# Patient Record
Sex: Male | Born: 1945 | Marital: Married | Smoking: Never smoker
Health system: Southern US, Community
[De-identification: ages and names within clinical notes are randomized; demographics above are authoritative.]

---

## 2011-02-21 ENCOUNTER — Emergency Department: Payer: Self-pay | Admitting: Emergency Medicine

## 2012-01-27 ENCOUNTER — Ambulatory Visit: Payer: Self-pay | Admitting: Orthopedic Surgery

## 2013-04-23 IMAGING — CR LEFT WRIST - COMPLETE 3+ VIEW
1 series · 4 of 4 positions shown · non-contrast
Comparison: none

REASON FOR EXAM: pain following trauma
COMMENTS:

PROCEDURE:     DXR - DXR WRIST LT COMP WITH OBLIQUES  - February 21, 2011  [DATE]
RESULT:     No fracture, dislocation or other acute bony abnormality is
identified.

[Series 1: w wrist pa left · 0.14mm/px · 4 of 4 slices shown]
[im 1/4]
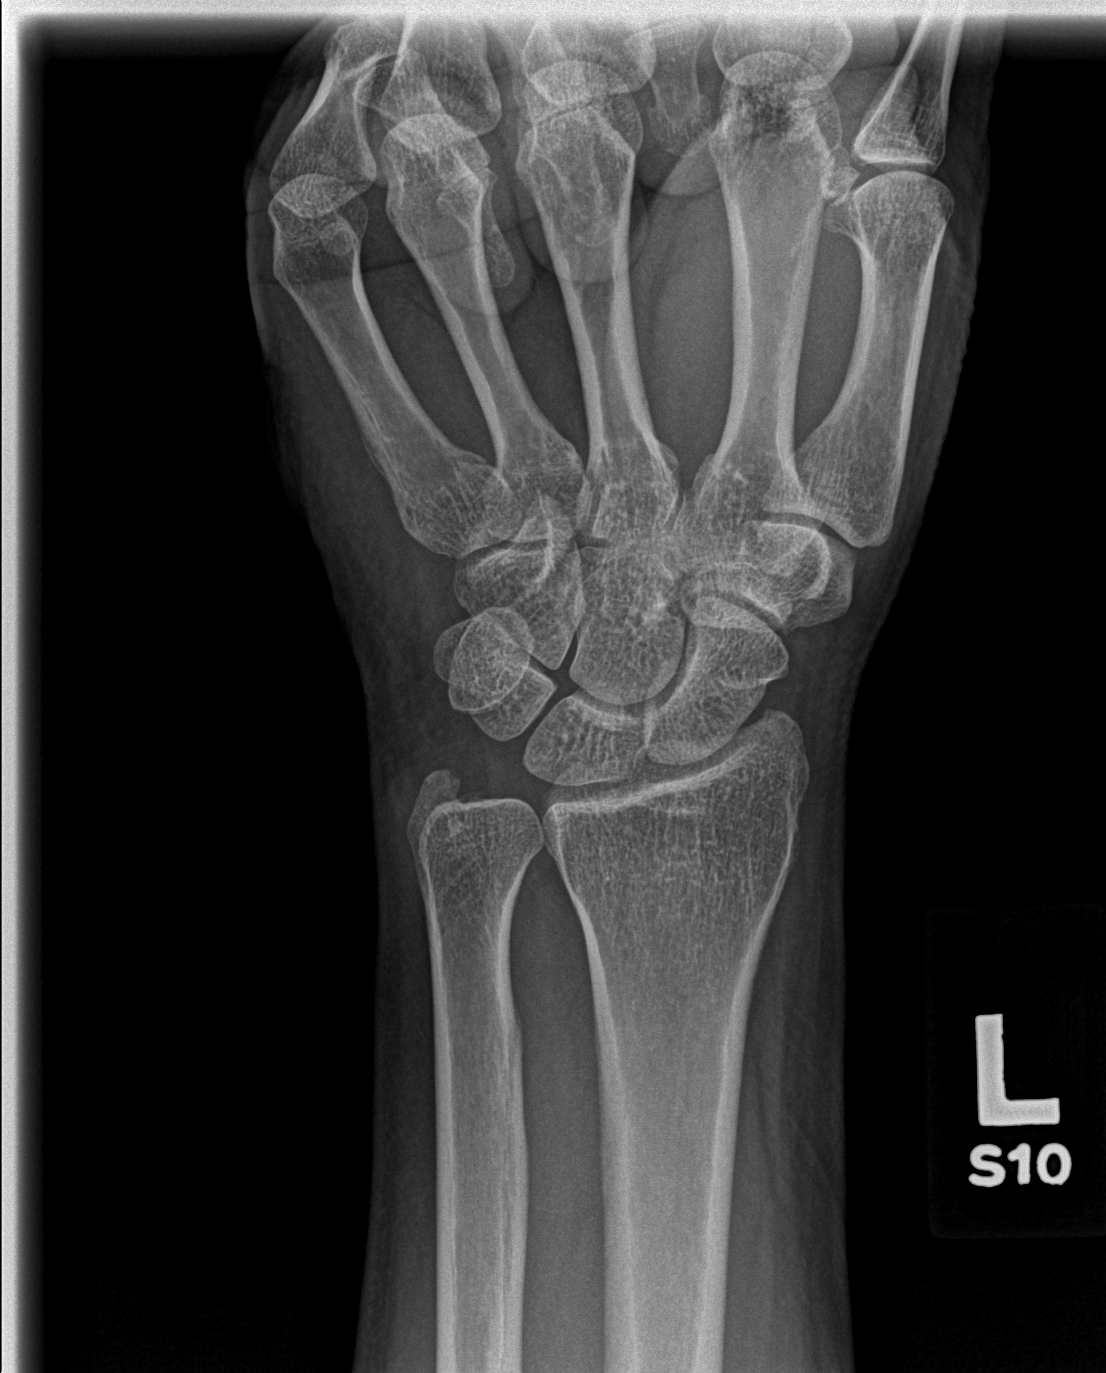
[im 2/4]
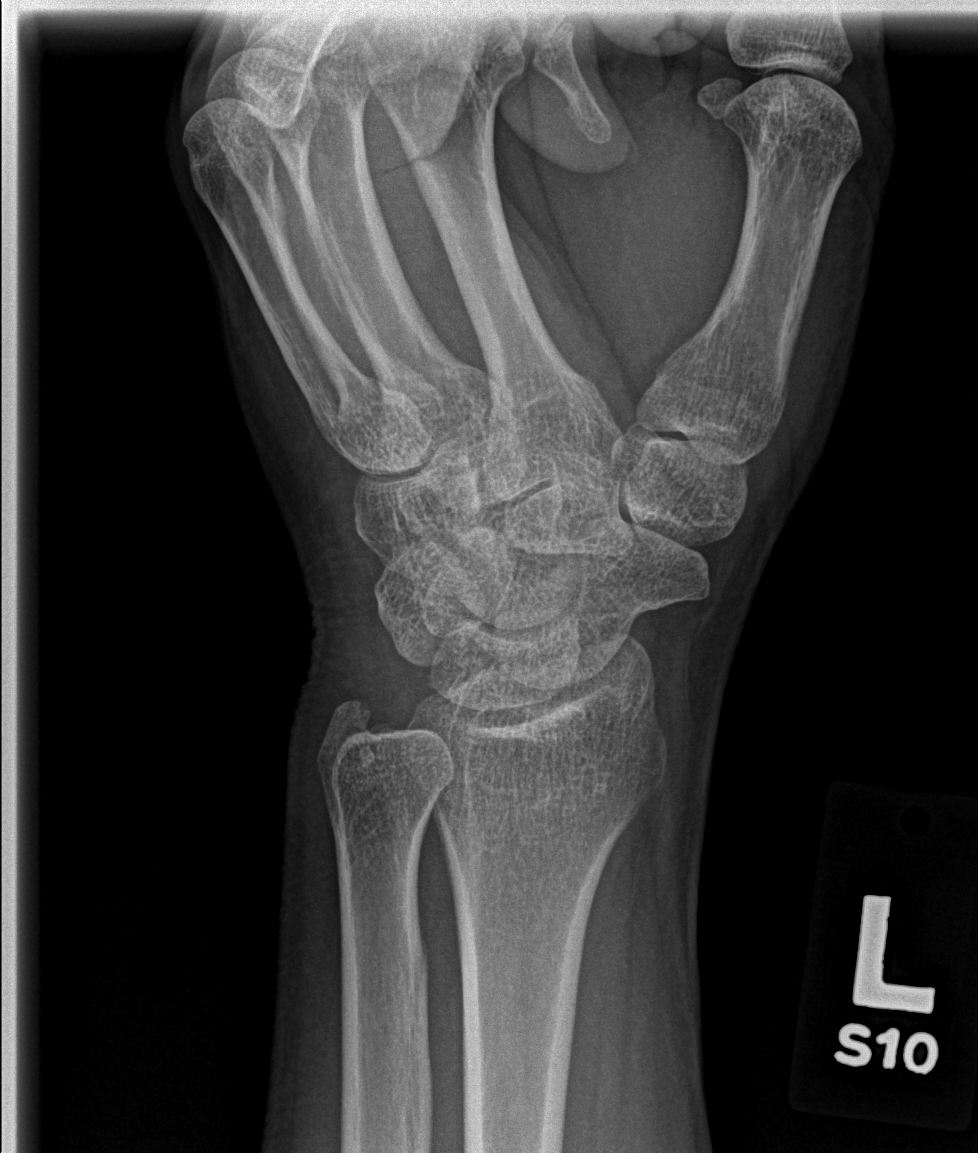
[im 3/4]
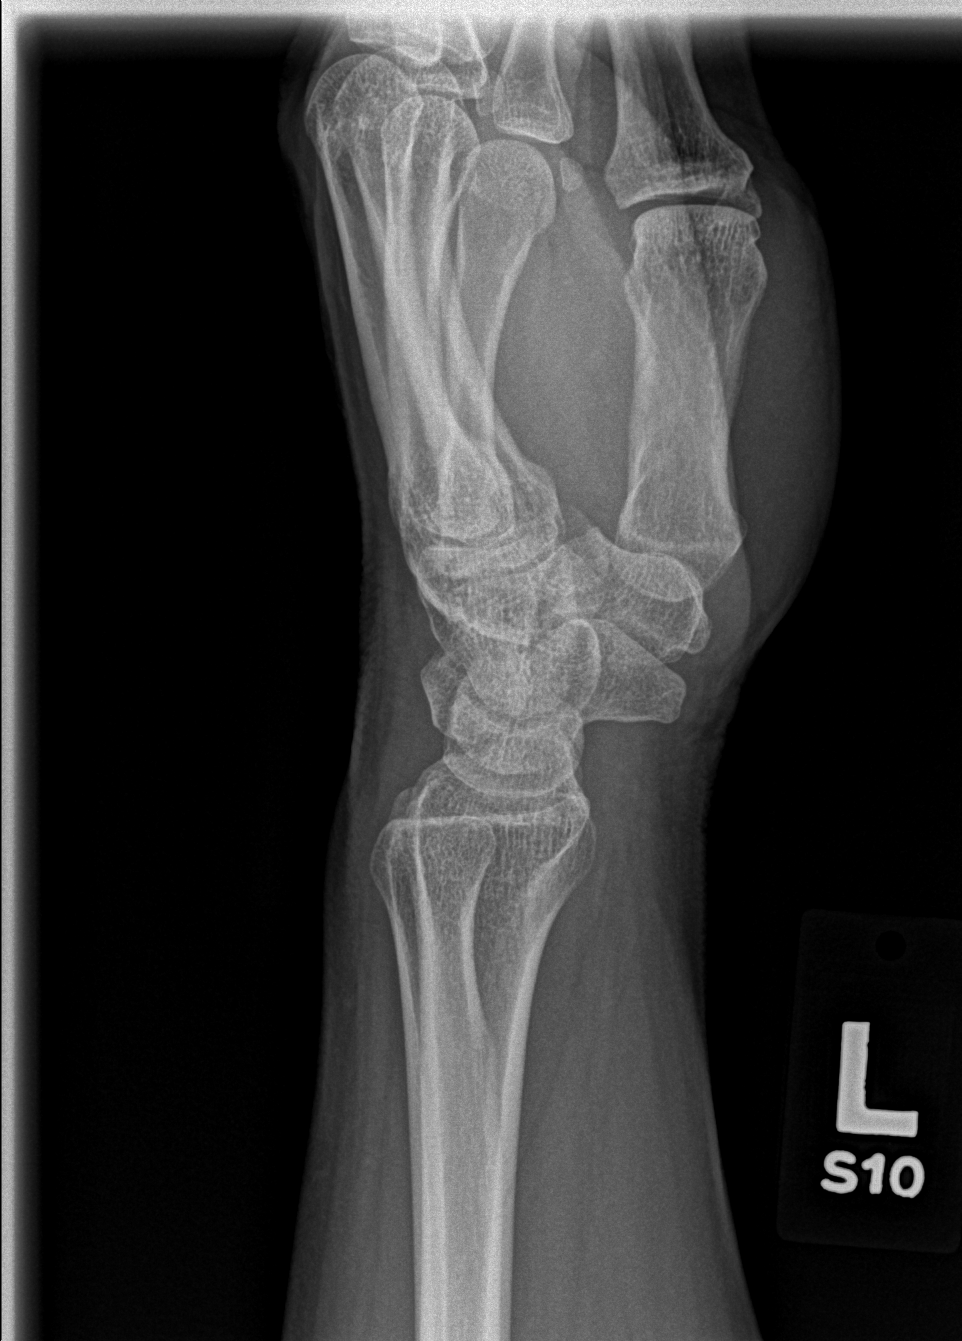
[im 4/4]
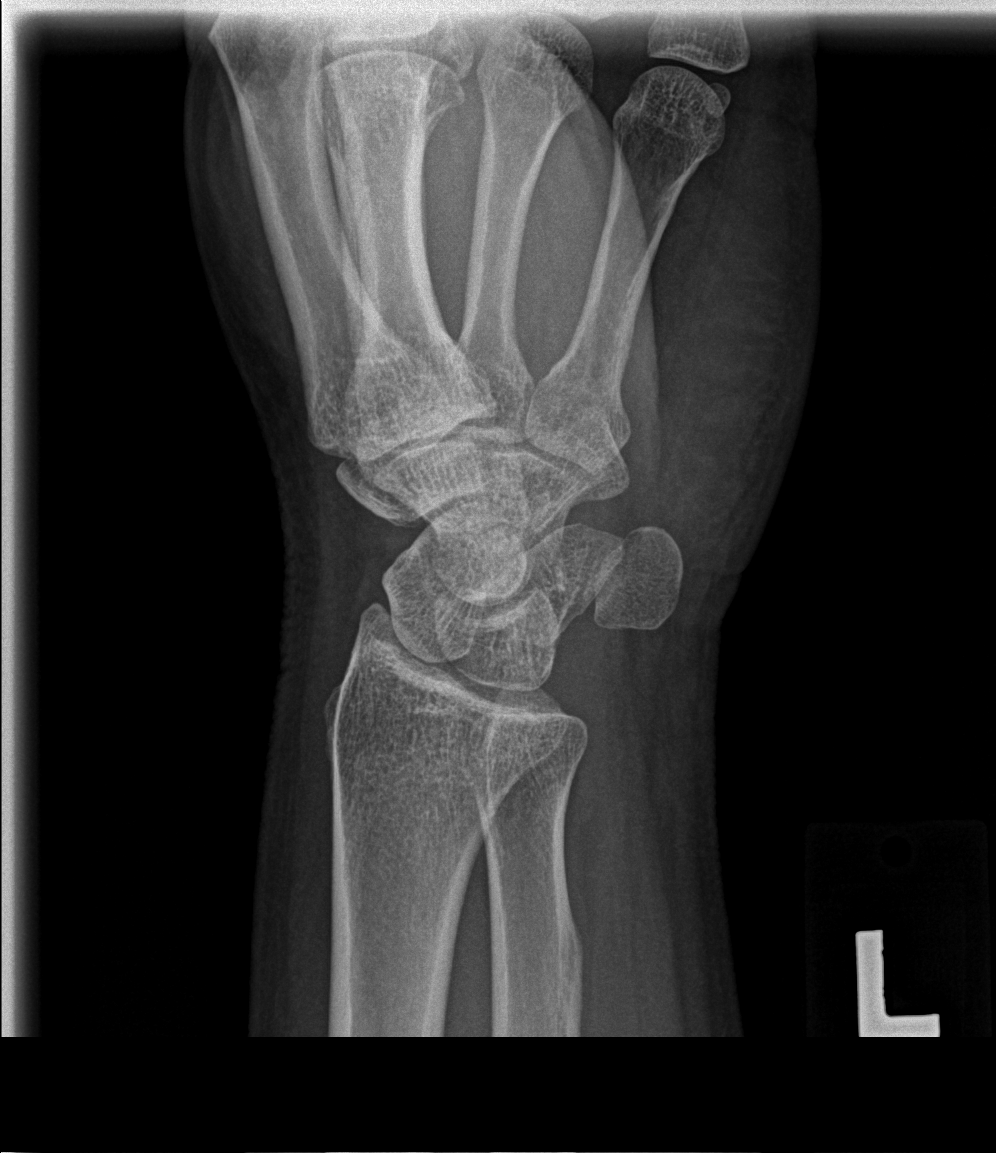

[4 of 4 positions shown; findings below may reference images not displayed]

IMPRESSION: No significant osseous abnormalities are noted.

## 2016-02-01 ENCOUNTER — Emergency Department
Admission: EM | Admit: 2016-02-01 | Discharge: 2016-02-01 | Disposition: A | Discharge status: Home / Self Care | Payer: Medicare Other | Attending: Emergency Medicine | Admitting: Emergency Medicine

## 2016-02-01 ENCOUNTER — Encounter: Payer: Self-pay | Admitting: Emergency Medicine

## 2016-02-01 DIAGNOSIS — S20369A Insect bite (nonvenomous) of unspecified front wall of thorax, initial encounter: Secondary | ICD-10-CM | POA: Diagnosis not present

## 2016-02-01 DIAGNOSIS — W57XXXA Bitten or stung by nonvenomous insect and other nonvenomous arthropods, initial encounter: Secondary | ICD-10-CM | POA: Diagnosis not present

## 2016-02-01 DIAGNOSIS — S30861A Insect bite (nonvenomous) of abdominal wall, initial encounter: Secondary | ICD-10-CM | POA: Diagnosis not present

## 2016-02-01 DIAGNOSIS — Y939 Activity, unspecified: Secondary | ICD-10-CM | POA: Insufficient documentation

## 2016-02-01 DIAGNOSIS — S40869A Insect bite (nonvenomous) of unspecified upper arm, initial encounter: Secondary | ICD-10-CM | POA: Diagnosis present

## 2016-02-01 DIAGNOSIS — S30860A Insect bite (nonvenomous) of lower back and pelvis, initial encounter: Secondary | ICD-10-CM | POA: Insufficient documentation

## 2016-02-01 DIAGNOSIS — Y999 Unspecified external cause status: Secondary | ICD-10-CM | POA: Insufficient documentation

## 2016-02-01 DIAGNOSIS — Y929 Unspecified place or not applicable: Secondary | ICD-10-CM | POA: Diagnosis not present

## 2016-02-01 DIAGNOSIS — S0096XA Insect bite (nonvenomous) of unspecified part of head, initial encounter: Secondary | ICD-10-CM | POA: Insufficient documentation

## 2016-02-01 DIAGNOSIS — B889 Infestation, unspecified: Secondary | ICD-10-CM | POA: Insufficient documentation

## 2016-02-01 DIAGNOSIS — B888 Other specified infestations: Secondary | ICD-10-CM

## 2016-02-01 MED ORDER — HYDROXYZINE PAMOATE 50 MG PO CAPS: 50.0000 mg | ORAL_CAPSULE | Freq: Three times a day (TID) | ORAL | 0 refills | Status: AC | PRN

## 2016-02-01 MED ORDER — PREDNISONE 10 MG PO TABS: 10.0000 mg | ORAL_TABLET | Freq: Every day | ORAL | 0 refills | Status: AC

## 2016-02-01 MED ORDER — TRIAMCINOLONE ACETONIDE 0.1 % EX CREA: 1.0000 "application " | TOPICAL_CREAM | Freq: Four times a day (QID) | CUTANEOUS | 0 refills | Status: AC

## 2016-02-01 NOTE — ED Triage Notes (Signed)
Pt reports being here for "bites". Pt reports most of the "bites" on his back. Pt reports problem has been ongoing for about 4 months.

## 2016-02-01 NOTE — ED Provider Notes (Signed)
[facility] Emergency Department Provider Note  ____________________________________________  Time seen: Approximately 3:51 PM  I have reviewed the triage vital signs and the nursing notes.   HISTORY  Chief Complaint Insect Bite    HPI Brandon Rollins is a 70 y.o. male who presents emergency department for "bug bites" to his back, chest, abdomen, arms, head. Per the patient and his daughter, by tendon present and worsening over the past several months. Per the daughter, the patient has a residence both in [place] as well as here and alternates between the 2. Daughter is concerned that patient may have bedbugs as she has had an infestation at her home after one of his visits. Areas are excoriated from scratching, very pruritic in nature. Patient denies seeing any definitive bugs.   No past medical history on file.  There are no active problems to display for this patient.   No past surgical history on file.  Prior to Admission medications   Medication Sig Start Date End Date Taking? Authorizing Provider  hydrOXYzine (VISTARIL) 50 MG capsule Take 1 capsule (50 mg total) by mouth 3 (three) times daily as needed. 02/01/16   Delorise RoyalsJonathan D Giann Obara, PA-C  predniSONE (DELTASONE) 10 MG tablet Take 1 tablet (10 mg total) by mouth daily. 02/01/16   Delorise RoyalsJonathan D Blossom Crume, PA-C  triamcinolone cream (KENALOG) 0.1 % Apply 1 application topically 4 (four) times daily. 02/01/16   Delorise RoyalsJonathan D Oceane Fosse, PA-C    Allergies Review of patient's allergies indicates no known allergies.  No family history on file.  Social History Social History  Substance Use Topics  . Smoking status: Never Smoker  . Smokeless tobacco: Not on file  . Alcohol use Not on file     Review of Systems  Constitutional: No fever/chills Cardiovascular: no chest pain. Respiratory: no cough. No SOB. Musculoskeletal: Negative for musculoskeletal pain. Skin: Positive for bug bites to back,  chest, abdomen, arms, head Neurological: Negative for headaches, focal weakness or numbness. 10-point ROS otherwise negative.  ____________________________________________   PHYSICAL EXAM:  VITAL SIGNS: ED Triage Vitals [02/01/16 1418]  Enc Vitals Group     BP (!) 141/75     Pulse Rate 66     Resp 18     Temp 98.1 F (36.7 C)     Temp Source Oral     SpO2 98 %     Weight 150 lb (68 kg)     Height 5\' 5"  (1.651 m)     Head Circumference      Peak Flow      Pain Score      Pain Loc      Pain Edu?      Excl. in GC?      Constitutional: Alert and oriented. Well appearing and in no acute distress. Eyes: Conjunctivae are normal. PERRL. EOMI. Head: Atraumatic. Cardiovascular: Normal rate, regular rhythm. Normal S1 and S2.  Good peripheral circulation. Respiratory: Normal respiratory effort without tachypnea or retractions. Lungs CTAB. Good air entry to the bases with no decreased or absent breath sounds. Musculoskeletal: Full range of motion to all extremities. No gross deformities appreciated. Neurologic:  Normal speech and language. No gross focal neurologic deficits are appreciated.  Skin:  Skin is warm, dry and intact. No rash noted. Patient has diffuse spread of erythematous lesions consistent with bedbug bites. Excoriations are noted from scratching. No surrounding erythema or edema consistent with infection. No bugs are definitively seen. Psychiatric: Mood and affect are normal. Speech and behavior are  normal. Patient exhibits appropriate insight and judgement.   ____________________________________________   LABS (all labs ordered are listed, but only abnormal results are displayed)  Labs Reviewed - No data to display ____________________________________________  EKG   ____________________________________________  RADIOLOGY   No results found.  ____________________________________________    PROCEDURES  Procedure(s) performed:     Procedures    Medications - No data to display   ____________________________________________   INITIAL IMPRESSION / ASSESSMENT AND PLAN / ED COURSE  Pertinent labs & imaging results that were available during my care of the patient were reviewed by me and considered in my medical decision making (see chart for details).  Review of the [place] CSRS was performed in accordance of the [facility] prior to dispensing any controlled drugs.  Clinical Course    Patient's diagnosis is consistent with Bedbug bite and infestation. Care instructions are given to patient and his*for eradication of same. Patient will be placed on multiple medications for symptom control as far as pruritus's concern.. She will follow-up with primary care as needed Patient is given ED precautions to return to the ED for any worsening or new symptoms.     ____________________________________________  FINAL CLINICAL IMPRESSION(S) / ED DIAGNOSES  Final diagnoses:  Infestation by bed bug  Bedbug bite, initial encounter      NEW MEDICATIONS STARTED DURING THIS VISIT:  New Prescriptions   HYDROXYZINE (VISTARIL) 50 MG CAPSULE    Take 1 capsule (50 mg total) by mouth 3 (three) times daily as needed.   PREDNISONE (DELTASONE) 10 MG TABLET    Take 1 tablet (10 mg total) by mouth daily.   TRIAMCINOLONE CREAM (KENALOG) 0.1 %    Apply 1 application topically 4 (four) times daily.        This chart was dictated using voice recognition software/Dragon. Despite best efforts to proofread, errors can occur which can change the meaning. Any change was purely unintentional.    Racheal PatchesJonathan D Ginna Schuur, PA-C 02/01/16 1557    Arnaldo NatalPaul F Malinda, MD 02/01/16 2204

## 2016-02-12 ENCOUNTER — Emergency Department
Admission: EM | Admit: 2016-02-12 | Discharge: 2016-02-12 | Disposition: A | Discharge status: Home / Self Care | Payer: Medicare Other | Attending: Emergency Medicine | Admitting: Emergency Medicine

## 2016-02-12 ENCOUNTER — Encounter: Payer: Self-pay | Admitting: Emergency Medicine

## 2016-02-12 DIAGNOSIS — R21 Rash and other nonspecific skin eruption: Secondary | ICD-10-CM | POA: Diagnosis present

## 2016-02-12 DIAGNOSIS — L509 Urticaria, unspecified: Secondary | ICD-10-CM | POA: Diagnosis not present

## 2016-02-12 MED ORDER — DIPHENHYDRAMINE HCL 25 MG PO CAPS: 25.0000 mg | ORAL_CAPSULE | ORAL | 0 refills | Status: AC | PRN

## 2016-02-12 MED ORDER — PERMETHRIN 5 % EX CREA
TOPICAL_CREAM | CUTANEOUS | 1 refills | Status: AC
Start: 2016-02-12 — End: ?

## 2016-02-12 NOTE — ED Provider Notes (Signed)
[facility] Emergency Department Provider Note  ____________________________________________  Time seen: Approximately 12:40 PM  I have reviewed the triage vital signs and the nursing notes.   HISTORY  Chief Complaint Rash    HPI Brandon Rollins is a 70 y.o. male , NAD, presents emergency department accompanied by his daughter who assists with translation and history. Patient states he continues to have itchy rash about his back and on his scalp as well as his lower legs. Was seen in this emergency department 2 weeks ago for the same, diagnosed with bedbug infestation and given oral steroids, and anti-itch medication and endocrine. His daughter notes that the lesions seem to be improving but have not resolved. Patient states he continues to itch about his scalp which is the worst. Has not noted any oozing, weeping, spreading of redness or new lesions. The daughter states they have treated there home as well as cleansed thoroughly to potentially eliminating any bedbug infestation has not seen any bugs in the home. States that the patient stays with her on the weekends and with another child Monday through Friday in another town. Daughter states she has not gone to the patient's other living quarters to evaluate for potential infestation. Patient has had no fevers, chills, body aches, chest pain, shortness of breath. No swelling about the joints.    History reviewed. No pertinent past medical history.  There are no active problems to display for this patient.   History reviewed. No pertinent surgical history.  Prior to Admission medications   Medication Sig Start Date End Date Taking? Authorizing Provider  diphenhydrAMINE (BENADRYL) 25 mg capsule Take 1 capsule (25 mg total) by mouth every 4 (four) hours as needed for itching. 02/12/16 02/11/17  Donnetta Gillin L Mecca Guitron, PA-C  hydrOXYzine (VISTARIL) 50 MG capsule Take 1 capsule (50 mg total) by mouth 3 (three) times daily as  needed. 02/01/16   Delorise RoyalsJonathan D Cuthriell, PA-C  permethrin (ELIMITE) 5 % cream Apply generously to external skin from the neck down to the soles of the feet prior to going to bed. Sleep in the solution and wash off 8 hours later. May reapply in 10 days if symptoms persist or return. 02/12/16   Cobey Raineri L Deanza Upperman, PA-C  predniSONE (DELTASONE) 10 MG tablet Take 1 tablet (10 mg total) by mouth daily. 02/01/16   Delorise RoyalsJonathan D Cuthriell, PA-C  triamcinolone cream (KENALOG) 0.1 % Apply 1 application topically 4 (four) times daily. 02/01/16   Delorise RoyalsJonathan D Cuthriell, PA-C    Allergies Patient has no known allergies.  History reviewed. No pertinent family history.  Social History Social History  Substance Use Topics  . Smoking status: Never Smoker  . Smokeless tobacco: Never Used  . Alcohol use No     Review of Systems  Constitutional: No fever/chills Cardiovascular: No chest pain. Respiratory: No shortness of breath.   Gastrointestinal: No abdominal pain.  No nausea, vomiting.  Musculoskeletal: Negative for General myalgias or joint swelling.  Skin: Positive for rash. Neurological: Negative for illness, weakness, tingling.  ____________________________________________   PHYSICAL EXAM:  VITAL SIGNS: ED Triage Vitals  Enc Vitals Group     BP 02/12/16 1223 111/79     Pulse --      Resp 02/12/16 1223 18     Temp 02/12/16 1223 98.3 F (36.8 C)     Temp Source 02/12/16 1223 Oral     SpO2 02/12/16 1223 100 %     Weight 02/12/16 1224 150 lb (68 kg)  Height 02/12/16 1224 5\' 5"  (1.651 m)     Head Circumference --      Peak Flow --      Pain Score 02/12/16 1225 0     Pain Loc --      Pain Edu? --      Excl. in GC? --      Constitutional: Alert and oriented. Well appearing and in no acute distress. Eyes: Conjunctivae are normal.  Head: Atraumatic. Neck: Supple with full range of motion. Hematological/Lymphatic/Immunilogical: No cervical lymphadenopathy. Cardiovascular: Good peripheral  circulation. Respiratory: Normal respiratory effort without tachypnea or retractions.  Musculoskeletal: Full range of motion of bilateral upper and more shortness of pain or difficulty. No lower extremity tenderness nor edema.  No joint effusions. Neurologic:  Normal speech and language. No gross focal neurologic deficits are appreciated.  Skin:  Multiple healing non-oozing or weeping skin sores noted about the back and lower extremities with excoriation from the patient scratching. No lesions noted about the scalp. No evidence of new lesion formation. No abscesses noted. Skin is warm, dry and intact. No rash noted. Psychiatric: Mood and affect are normal. Speech and behavior are normal. Patient exhibits appropriate insight and judgement.   ____________________________________________   LABS  None ____________________________________________  EKG  None ____________________________________________  RADIOLOGY  None ____________________________________________    PROCEDURES  Procedure(s) performed: None   Procedures   Medications - No data to display   ____________________________________________   INITIAL IMPRESSION / ASSESSMENT AND PLAN / ED COURSE  Pertinent labs & imaging results that were available during my care of the patient were reviewed by me and considered in my medical decision making (see chart for details).  Clinical Course     Patient's diagnosis is consistent with Rash and nonspecific skin eruption with urticaria. Patient's lesions are healing well and no evidence of skin infection or new lesions are noted. Patient and his daughter were reassured but patient will be discharged home with prescriptions for permethrin cream and diphenhydramine to take to ensure there is no skin infestation and to continue to treat itching. Patient is to follow up with his primary care or dermatology if symptoms persist past this treatment course. Patient is given ED  precautions to return to the ED for any worsening or new symptoms.   ____________________________________________  FINAL CLINICAL IMPRESSION(S) / ED DIAGNOSES  Final diagnoses:  Rash and nonspecific skin eruption  Urticaria      NEW MEDICATIONS STARTED DURING THIS VISIT:  Discharge Medication List as of 02/12/2016 12:46 PM    START taking these medications   Details  diphenhydrAMINE (BENADRYL) 25 mg capsule Take 1 capsule (25 mg total) by mouth every 4 (four) hours as needed for itching., Starting Sat 02/12/2016, Until Sun 02/11/2017, Print    permethrin (ELIMITE) 5 % cream Apply generously to external skin from the neck down to the soles of the feet prior to going to bed. Sleep in the solution and wash off 8 hours later. May reapply in 10 days if symptoms persist or return., Print             Hope PigeonJami L Harim Bi, PA-C 02/12/16 1435    Phineas SemenGraydon Goodman, MD 02/12/16 1544

## 2016-12-20 DIAGNOSIS — L309 Dermatitis, unspecified: Secondary | ICD-10-CM | POA: Diagnosis not present
